# Patient Record
Sex: Male | Born: 1950 | Race: Black or African American | Hispanic: No | Marital: Married | Smoking: Never smoker
Health system: Southern US, Community
[De-identification: ages and names within clinical notes are randomized; demographics above are authoritative.]

---

## 1998-05-19 ENCOUNTER — Emergency Department (HOSPITAL_COMMUNITY): Admission: EM | Admit: 1998-05-19 | Discharge: 1998-05-19 | Payer: Self-pay | Admitting: Emergency Medicine

## 2002-04-16 ENCOUNTER — Emergency Department (HOSPITAL_COMMUNITY): Admission: EM | Admit: 2002-04-16 | Discharge: 2002-04-16 | Payer: Self-pay | Admitting: Emergency Medicine

## 2008-07-28 ENCOUNTER — Encounter: Admission: RE | Admit: 2008-07-28 | Discharge: 2008-07-28 | Payer: Self-pay | Admitting: Internal Medicine

## 2009-11-02 ENCOUNTER — Encounter: Admission: RE | Admit: 2009-11-02 | Discharge: 2009-11-02 | Payer: Self-pay | Admitting: Internal Medicine

## 2010-04-16 ENCOUNTER — Emergency Department (HOSPITAL_COMMUNITY): Admission: EM | Admit: 2010-04-16 | Discharge: 2010-04-16 | Payer: Self-pay | Admitting: Family Medicine

## 2010-04-30 ENCOUNTER — Encounter: Admission: RE | Admit: 2010-04-30 | Discharge: 2010-04-30 | Payer: Self-pay | Admitting: Family Medicine

## 2010-12-19 ENCOUNTER — Encounter: Payer: Self-pay | Admitting: Family Medicine

## 2011-03-13 IMAGING — CT CT L SPINE W/O CM
3 of 11 series · 12 of 33 positions shown, 14 images · non-contrast
Comparison: Radiography [DATE]

CLINICAL DATA: Low back pain.  Bilateral leg pain.

CT LUMBAR SPINE WITHOUT CONTRAST
TECHNIQUE: Multidetector CT imaging of the lumbar spine was
performed without intravenous contrast administration. Multiplanar
CT image reconstructions were also generated.

[Series 4: thin recons · axial · 0.27mm/px · z∈[-220,-56]mm · 4 of 881 slices shown, 5 images]
[im 177/881  soft-tissue]
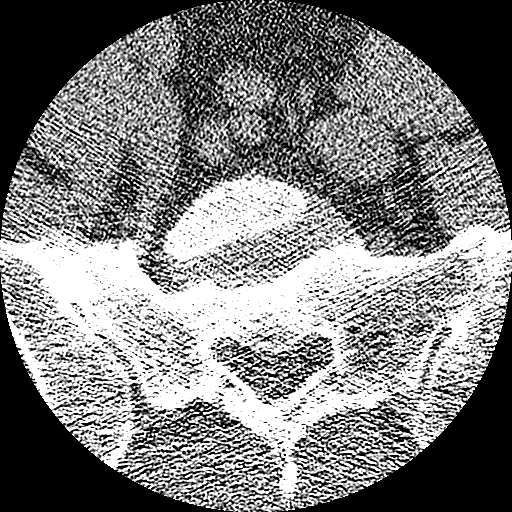
[im 177/881  bone]
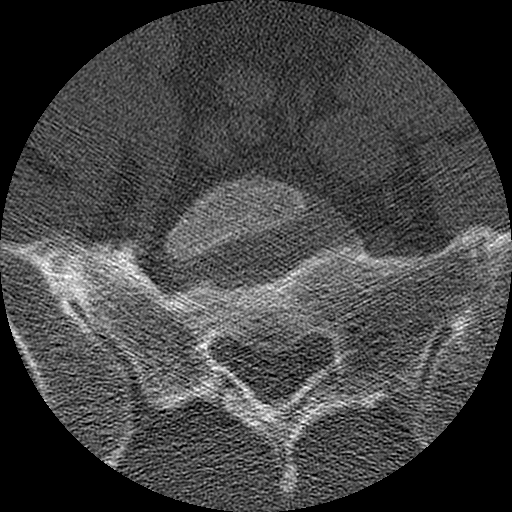
[im 353/881  bone]
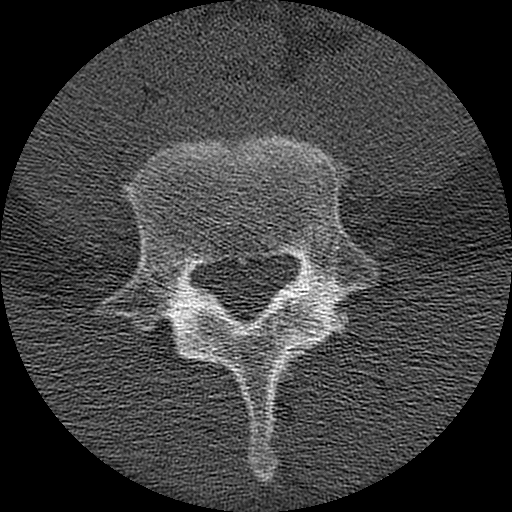
[im 529/881  bone]
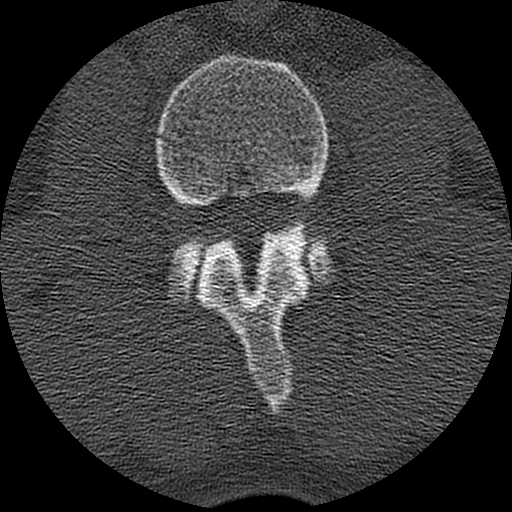
[im 705/881  bone]
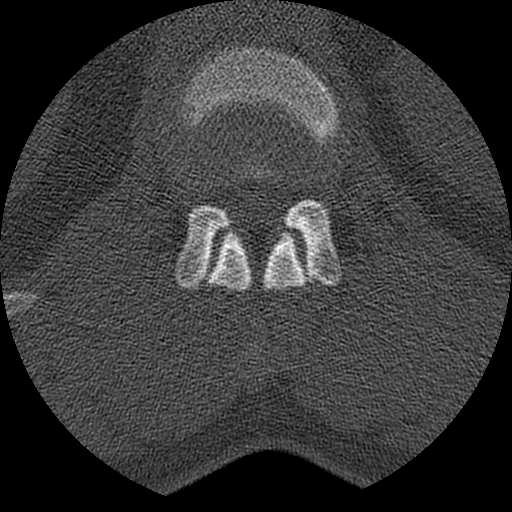

[Series 400: cor l-spine · coronal · 0.55mm/px · 3 of 60 slices shown]
[im 12/60  bone]
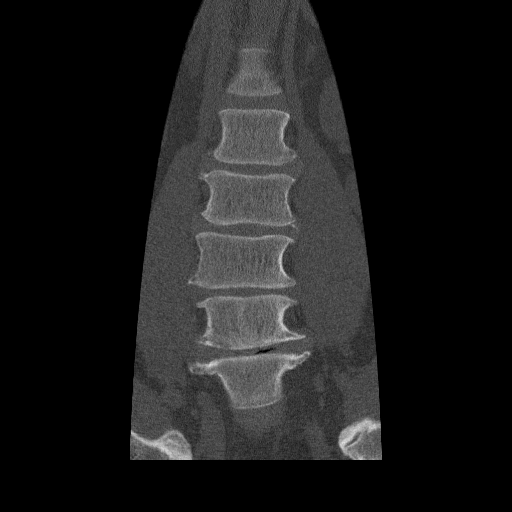
[im 24/60  bone]
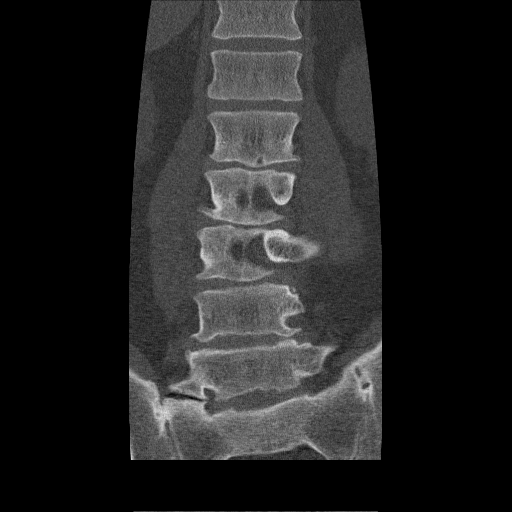
[im 36/60  bone]
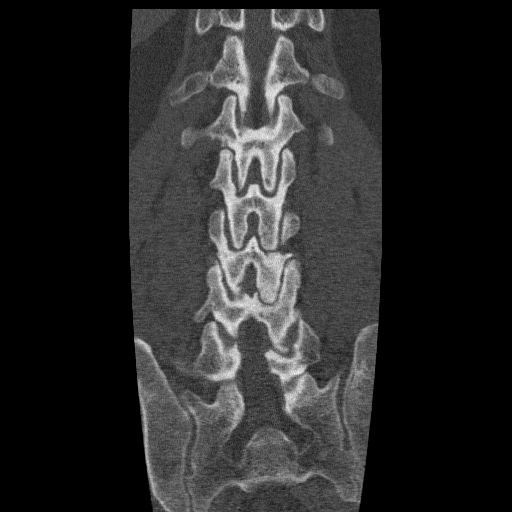

[Series 401: sag l-spine · sagittal · 0.55mm/px · 5 of 60 slices shown, 6 images]
[im 20/60  bone]
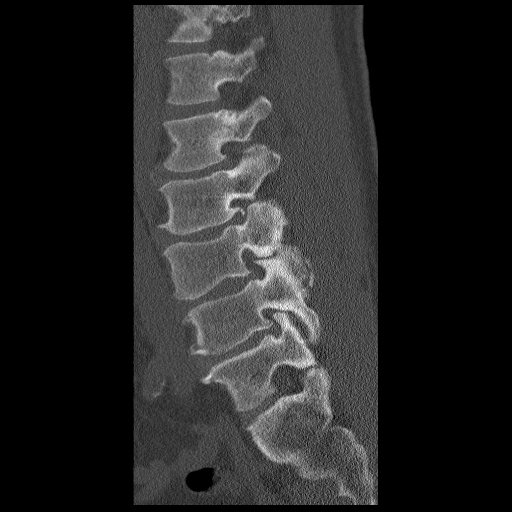
[im 25/60  bone]
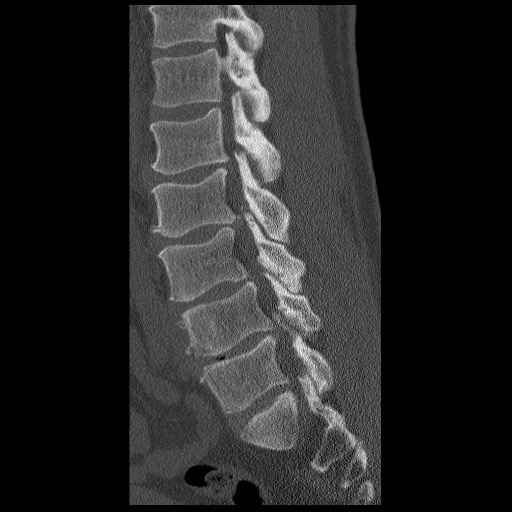
[im 30/60  soft-tissue]
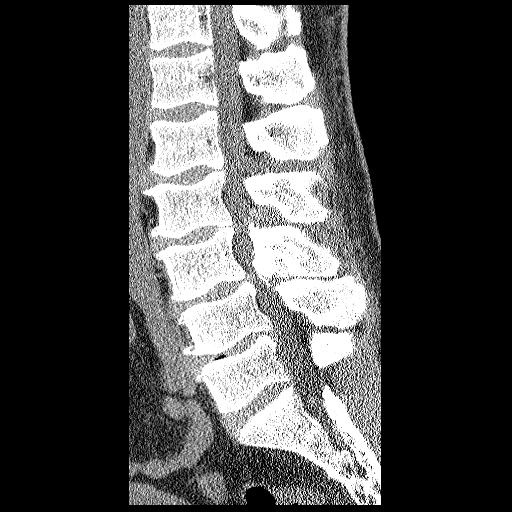
[im 30/60  bone]
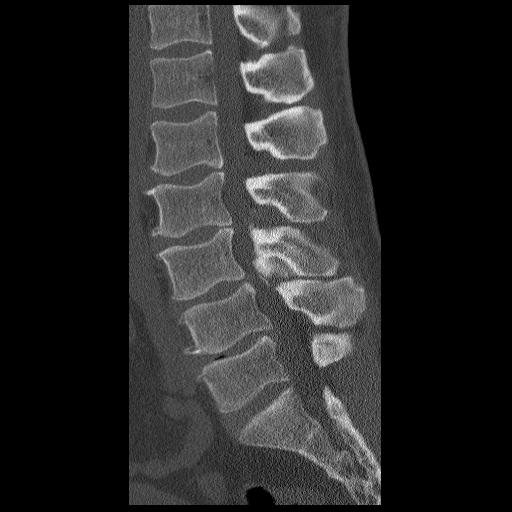
[im 35/60  bone]
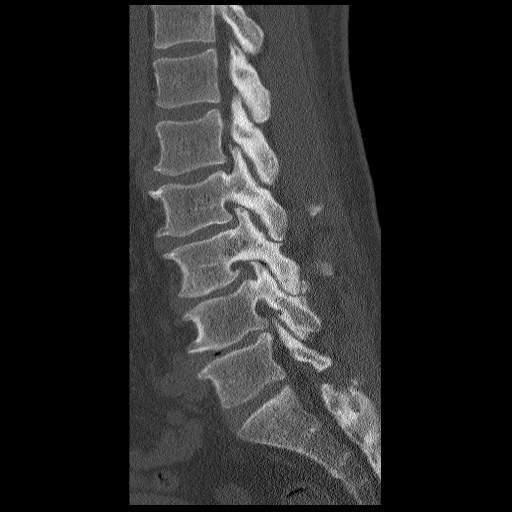
[im 40/60  bone]
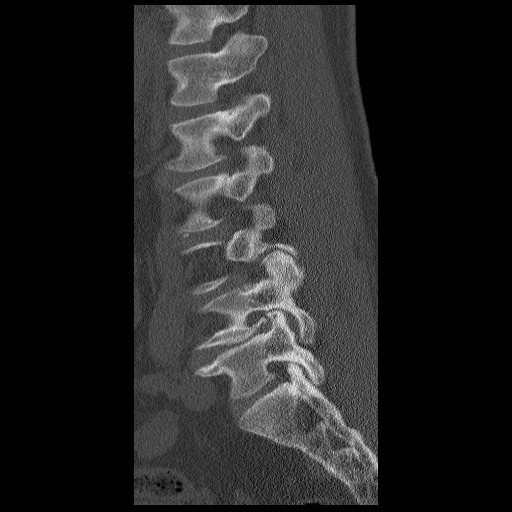

[12 of 33 positions shown; findings below may reference images not displayed]

FINDINGS: There is mild scoliosis convex to the left.  There are
diminutive twelfth ribs.

T11-12 and T12-L1:  Unremarkable interspaces.

L1-2:  There is severe spinal stenosis.  The canal small on a
congenital basis.  There are endplate osteophytes there is bulging
of the disc.  There is facet and ligamentous hypertrophy with
ligamentous calcification.  Neural compression could occur at this
level.

L2-3:  There is severe spinal stenosis.  The canal small on a
congenital basis.  There are endplate osteophytes there is broad-
based herniation of disc material.  The facets ligaments are
hypertrophic and there is ligamentous calcification.  There is
foraminal stenosis bilaterally, right worse than left.

L3-4:  There is severe spinal stenosis.  There are endplate
osteophytes and there is shallow protrusion of disc material.
There is advanced facet arthropathy and ligamentous hypertrophy.
There is anterolisthesis of 2 mm.  There is neural foraminal
stenosis bilaterally.

L4-5:  There is moderate multifactorial stenosis in general with
more severe stenoses of the lateral recesses and neural foramina
bilaterally.  Contributing factors include facet arthropathy and
ligamentous hypertrophy and bulging of the disc.

L5-S1:  There is some transitional features.  Central canal is
sufficiently patent.  The disc bulges mildly.  There is mild
foraminal narrowing bilaterally.  There is an anomalous
articulation on the right with the sacrum that shows some
degenerative change.

Sacral iliac joints show ankylosis bilaterally.
IMPRESSION: L5 is transitional.

Severe multifactorial spinal stenosis at L1-2, L2-3 and L3-4 as
outlined above.

Moderate stenosis at L4-5 of the central canal.  More severe
stenosis of the lateral recesses and neural foramina bilaterally.

Degenerative change at the articulation of the right side of L5 and
the sacrum.  Bilateral sacroiliac ankylosis.

## 2015-03-17 DIAGNOSIS — Z136 Encounter for screening for cardiovascular disorders: Secondary | ICD-10-CM | POA: Diagnosis not present

## 2015-03-17 DIAGNOSIS — Z125 Encounter for screening for malignant neoplasm of prostate: Secondary | ICD-10-CM | POA: Diagnosis not present

## 2015-03-17 DIAGNOSIS — Z23 Encounter for immunization: Secondary | ICD-10-CM | POA: Diagnosis not present

## 2015-03-17 DIAGNOSIS — R972 Elevated prostate specific antigen [PSA]: Secondary | ICD-10-CM | POA: Diagnosis not present

## 2015-03-17 DIAGNOSIS — Z0000 Encounter for general adult medical examination without abnormal findings: Secondary | ICD-10-CM | POA: Diagnosis not present

## 2015-03-17 DIAGNOSIS — Z131 Encounter for screening for diabetes mellitus: Secondary | ICD-10-CM | POA: Diagnosis not present

## 2016-04-26 DIAGNOSIS — H401121 Primary open-angle glaucoma, left eye, mild stage: Secondary | ICD-10-CM | POA: Diagnosis not present

## 2016-04-26 DIAGNOSIS — H1033 Unspecified acute conjunctivitis, bilateral: Secondary | ICD-10-CM | POA: Diagnosis not present

## 2016-05-19 DIAGNOSIS — H401121 Primary open-angle glaucoma, left eye, mild stage: Secondary | ICD-10-CM | POA: Diagnosis not present

## 2016-08-22 DIAGNOSIS — R6889 Other general symptoms and signs: Secondary | ICD-10-CM | POA: Diagnosis not present

## 2016-08-22 DIAGNOSIS — J069 Acute upper respiratory infection, unspecified: Secondary | ICD-10-CM | POA: Diagnosis not present

## 2016-10-17 DIAGNOSIS — R0989 Other specified symptoms and signs involving the circulatory and respiratory systems: Secondary | ICD-10-CM | POA: Diagnosis not present

## 2016-10-17 DIAGNOSIS — R1310 Dysphagia, unspecified: Secondary | ICD-10-CM | POA: Diagnosis not present

## 2017-08-16 DIAGNOSIS — Z23 Encounter for immunization: Secondary | ICD-10-CM | POA: Diagnosis not present

## 2017-08-16 DIAGNOSIS — L84 Corns and callosities: Secondary | ICD-10-CM | POA: Diagnosis not present

## 2017-08-16 DIAGNOSIS — Z136 Encounter for screening for cardiovascular disorders: Secondary | ICD-10-CM | POA: Diagnosis not present

## 2017-08-16 DIAGNOSIS — Z125 Encounter for screening for malignant neoplasm of prostate: Secondary | ICD-10-CM | POA: Diagnosis not present

## 2017-08-16 DIAGNOSIS — Z131 Encounter for screening for diabetes mellitus: Secondary | ICD-10-CM | POA: Diagnosis not present

## 2017-08-16 DIAGNOSIS — Z0000 Encounter for general adult medical examination without abnormal findings: Secondary | ICD-10-CM | POA: Diagnosis not present

## 2017-08-28 ENCOUNTER — Ambulatory Visit: Payer: Medicare HMO | Admitting: Podiatry

## 2017-09-06 ENCOUNTER — Ambulatory Visit (INDEPENDENT_AMBULATORY_CARE_PROVIDER_SITE_OTHER): Payer: Medicare HMO | Admitting: Podiatry

## 2017-09-06 ENCOUNTER — Encounter: Payer: Self-pay | Admitting: Podiatry

## 2017-09-06 VITALS — BP 150/85 | HR 64 | Resp 16

## 2017-09-06 DIAGNOSIS — M79675 Pain in left toe(s): Secondary | ICD-10-CM | POA: Diagnosis not present

## 2017-09-06 DIAGNOSIS — M79674 Pain in right toe(s): Secondary | ICD-10-CM | POA: Diagnosis not present

## 2017-09-06 DIAGNOSIS — B351 Tinea unguium: Secondary | ICD-10-CM

## 2017-09-06 DIAGNOSIS — L84 Corns and callosities: Secondary | ICD-10-CM | POA: Diagnosis not present

## 2017-09-06 NOTE — Progress Notes (Signed)
   Subjective:    Patient ID: Benjamin Stafford, male    DOB: 09-Mar-1951, 66 y.o.   MRN: [id number]  HPI    Review of Systems  All other systems reviewed and are negative.      Objective:   Physical Exam        Assessment & Plan:

## 2017-12-11 ENCOUNTER — Encounter: Payer: Medicare HMO | Admitting: Podiatry

## 2017-12-18 NOTE — Progress Notes (Signed)
This encounter was created in error - please disregard.

## 2018-03-22 DIAGNOSIS — R972 Elevated prostate specific antigen [PSA]: Secondary | ICD-10-CM | POA: Diagnosis not present

## 2018-03-22 DIAGNOSIS — Z131 Encounter for screening for diabetes mellitus: Secondary | ICD-10-CM | POA: Diagnosis not present

## 2018-03-22 DIAGNOSIS — E782 Mixed hyperlipidemia: Secondary | ICD-10-CM | POA: Diagnosis not present

## 2018-03-22 DIAGNOSIS — R200 Anesthesia of skin: Secondary | ICD-10-CM | POA: Diagnosis not present

## 2018-03-22 DIAGNOSIS — Z125 Encounter for screening for malignant neoplasm of prostate: Secondary | ICD-10-CM | POA: Diagnosis not present

## 2018-03-22 DIAGNOSIS — G5632 Lesion of radial nerve, left upper limb: Secondary | ICD-10-CM | POA: Diagnosis not present

## 2020-12-01 DIAGNOSIS — Z510 Encounter for antineoplastic radiation therapy: Secondary | ICD-10-CM | POA: Diagnosis not present

## 2020-12-03 DIAGNOSIS — Z510 Encounter for antineoplastic radiation therapy: Secondary | ICD-10-CM | POA: Diagnosis not present

## 2020-12-04 DIAGNOSIS — Z510 Encounter for antineoplastic radiation therapy: Secondary | ICD-10-CM | POA: Diagnosis not present

## 2020-12-07 DIAGNOSIS — Z510 Encounter for antineoplastic radiation therapy: Secondary | ICD-10-CM | POA: Diagnosis not present

## 2020-12-08 DIAGNOSIS — Z510 Encounter for antineoplastic radiation therapy: Secondary | ICD-10-CM | POA: Diagnosis not present

## 2020-12-09 DIAGNOSIS — Z510 Encounter for antineoplastic radiation therapy: Secondary | ICD-10-CM | POA: Diagnosis not present

## 2020-12-10 DIAGNOSIS — Z510 Encounter for antineoplastic radiation therapy: Secondary | ICD-10-CM | POA: Diagnosis not present

## 2020-12-11 DIAGNOSIS — Z510 Encounter for antineoplastic radiation therapy: Secondary | ICD-10-CM | POA: Diagnosis not present

## 2020-12-15 DIAGNOSIS — Z510 Encounter for antineoplastic radiation therapy: Secondary | ICD-10-CM | POA: Diagnosis not present

## 2020-12-16 DIAGNOSIS — Z510 Encounter for antineoplastic radiation therapy: Secondary | ICD-10-CM | POA: Diagnosis not present

## 2020-12-17 DIAGNOSIS — Z510 Encounter for antineoplastic radiation therapy: Secondary | ICD-10-CM | POA: Diagnosis not present

## 2020-12-18 DIAGNOSIS — Z510 Encounter for antineoplastic radiation therapy: Secondary | ICD-10-CM | POA: Diagnosis not present

## 2020-12-21 DIAGNOSIS — Z510 Encounter for antineoplastic radiation therapy: Secondary | ICD-10-CM | POA: Diagnosis not present

## 2020-12-22 DIAGNOSIS — Z510 Encounter for antineoplastic radiation therapy: Secondary | ICD-10-CM | POA: Diagnosis not present

## 2020-12-23 DIAGNOSIS — Z510 Encounter for antineoplastic radiation therapy: Secondary | ICD-10-CM | POA: Diagnosis not present

## 2020-12-25 DIAGNOSIS — Z510 Encounter for antineoplastic radiation therapy: Secondary | ICD-10-CM | POA: Diagnosis not present

## 2020-12-28 DIAGNOSIS — Z510 Encounter for antineoplastic radiation therapy: Secondary | ICD-10-CM | POA: Diagnosis not present

## 2020-12-29 DIAGNOSIS — Z510 Encounter for antineoplastic radiation therapy: Secondary | ICD-10-CM | POA: Diagnosis not present

## 2020-12-29 DIAGNOSIS — N1330 Unspecified hydronephrosis: Secondary | ICD-10-CM | POA: Diagnosis not present

## 2020-12-29 DIAGNOSIS — N200 Calculus of kidney: Secondary | ICD-10-CM | POA: Diagnosis not present

## 2020-12-30 DIAGNOSIS — Z510 Encounter for antineoplastic radiation therapy: Secondary | ICD-10-CM | POA: Diagnosis not present

## 2020-12-31 DIAGNOSIS — Z510 Encounter for antineoplastic radiation therapy: Secondary | ICD-10-CM | POA: Diagnosis not present

## 2021-01-01 DIAGNOSIS — Z510 Encounter for antineoplastic radiation therapy: Secondary | ICD-10-CM | POA: Diagnosis not present

## 2021-01-04 DIAGNOSIS — Z510 Encounter for antineoplastic radiation therapy: Secondary | ICD-10-CM | POA: Diagnosis not present

## 2021-01-05 DIAGNOSIS — Z510 Encounter for antineoplastic radiation therapy: Secondary | ICD-10-CM | POA: Diagnosis not present

## 2021-01-06 DIAGNOSIS — Z510 Encounter for antineoplastic radiation therapy: Secondary | ICD-10-CM | POA: Diagnosis not present

## 2021-01-07 DIAGNOSIS — Z510 Encounter for antineoplastic radiation therapy: Secondary | ICD-10-CM | POA: Diagnosis not present

## 2021-01-08 DIAGNOSIS — Z510 Encounter for antineoplastic radiation therapy: Secondary | ICD-10-CM | POA: Diagnosis not present

## 2021-01-11 DIAGNOSIS — Z510 Encounter for antineoplastic radiation therapy: Secondary | ICD-10-CM | POA: Diagnosis not present

## 2021-01-12 DIAGNOSIS — Z510 Encounter for antineoplastic radiation therapy: Secondary | ICD-10-CM | POA: Diagnosis not present

## 2021-01-13 DIAGNOSIS — Z510 Encounter for antineoplastic radiation therapy: Secondary | ICD-10-CM | POA: Diagnosis not present

## 2021-01-14 DIAGNOSIS — Z510 Encounter for antineoplastic radiation therapy: Secondary | ICD-10-CM | POA: Diagnosis not present

## 2021-01-15 DIAGNOSIS — Z510 Encounter for antineoplastic radiation therapy: Secondary | ICD-10-CM | POA: Diagnosis not present

## 2021-01-18 DIAGNOSIS — Z510 Encounter for antineoplastic radiation therapy: Secondary | ICD-10-CM | POA: Diagnosis not present

## 2021-01-19 DIAGNOSIS — Z510 Encounter for antineoplastic radiation therapy: Secondary | ICD-10-CM | POA: Diagnosis not present

## 2021-01-20 DIAGNOSIS — Z510 Encounter for antineoplastic radiation therapy: Secondary | ICD-10-CM | POA: Diagnosis not present

## 2021-01-21 DIAGNOSIS — Z510 Encounter for antineoplastic radiation therapy: Secondary | ICD-10-CM | POA: Diagnosis not present

## 2021-01-22 DIAGNOSIS — Z510 Encounter for antineoplastic radiation therapy: Secondary | ICD-10-CM | POA: Diagnosis not present

## 2021-01-25 DIAGNOSIS — Z510 Encounter for antineoplastic radiation therapy: Secondary | ICD-10-CM | POA: Diagnosis not present

## 2021-01-26 DIAGNOSIS — Z510 Encounter for antineoplastic radiation therapy: Secondary | ICD-10-CM | POA: Diagnosis not present

## 2021-04-23 DIAGNOSIS — Z5111 Encounter for antineoplastic chemotherapy: Secondary | ICD-10-CM | POA: Diagnosis not present

## 2021-07-14 DIAGNOSIS — M1712 Unilateral primary osteoarthritis, left knee: Secondary | ICD-10-CM | POA: Diagnosis not present

## 2021-07-14 DIAGNOSIS — M1711 Unilateral primary osteoarthritis, right knee: Secondary | ICD-10-CM | POA: Diagnosis not present

## 2021-12-02 DIAGNOSIS — M4319 Spondylolisthesis, multiple sites in spine: Secondary | ICD-10-CM | POA: Diagnosis not present

## 2021-12-02 DIAGNOSIS — I700 Atherosclerosis of aorta: Secondary | ICD-10-CM | POA: Diagnosis not present

## 2021-12-02 DIAGNOSIS — Z041 Encounter for examination and observation following transport accident: Secondary | ICD-10-CM | POA: Diagnosis not present

## 2021-12-02 DIAGNOSIS — K7689 Other specified diseases of liver: Secondary | ICD-10-CM | POA: Diagnosis not present

## 2021-12-02 DIAGNOSIS — M5126 Other intervertebral disc displacement, lumbar region: Secondary | ICD-10-CM | POA: Diagnosis not present

## 2021-12-02 DIAGNOSIS — K4090 Unilateral inguinal hernia, without obstruction or gangrene, not specified as recurrent: Secondary | ICD-10-CM | POA: Diagnosis not present

## 2021-12-02 DIAGNOSIS — S298XXA Other specified injuries of thorax, initial encounter: Secondary | ICD-10-CM | POA: Diagnosis not present

## 2021-12-02 DIAGNOSIS — Z743 Need for continuous supervision: Secondary | ICD-10-CM | POA: Diagnosis not present

## 2021-12-02 DIAGNOSIS — R911 Solitary pulmonary nodule: Secondary | ICD-10-CM | POA: Diagnosis not present

## 2021-12-02 DIAGNOSIS — K5730 Diverticulosis of large intestine without perforation or abscess without bleeding: Secondary | ICD-10-CM | POA: Diagnosis not present

## 2021-12-02 DIAGNOSIS — R6889 Other general symptoms and signs: Secondary | ICD-10-CM | POA: Diagnosis not present

## 2021-12-02 DIAGNOSIS — Y999 Unspecified external cause status: Secondary | ICD-10-CM | POA: Diagnosis not present

## 2021-12-02 DIAGNOSIS — K429 Umbilical hernia without obstruction or gangrene: Secondary | ICD-10-CM | POA: Diagnosis not present

## 2021-12-02 DIAGNOSIS — M47816 Spondylosis without myelopathy or radiculopathy, lumbar region: Secondary | ICD-10-CM | POA: Diagnosis not present

## 2021-12-02 DIAGNOSIS — S0990XA Unspecified injury of head, initial encounter: Secondary | ICD-10-CM | POA: Diagnosis not present

## 2021-12-02 DIAGNOSIS — R10813 Right lower quadrant abdominal tenderness: Secondary | ICD-10-CM | POA: Diagnosis not present

## 2021-12-02 DIAGNOSIS — M2578 Osteophyte, vertebrae: Secondary | ICD-10-CM | POA: Diagnosis not present

## 2021-12-02 DIAGNOSIS — M4807 Spinal stenosis, lumbosacral region: Secondary | ICD-10-CM | POA: Diagnosis not present

## 2021-12-02 DIAGNOSIS — M542 Cervicalgia: Secondary | ICD-10-CM | POA: Diagnosis not present

## 2021-12-02 DIAGNOSIS — M47817 Spondylosis without myelopathy or radiculopathy, lumbosacral region: Secondary | ICD-10-CM | POA: Diagnosis not present

## 2021-12-23 DIAGNOSIS — S8992XA Unspecified injury of left lower leg, initial encounter: Secondary | ICD-10-CM | POA: Diagnosis not present

## 2021-12-23 DIAGNOSIS — E782 Mixed hyperlipidemia: Secondary | ICD-10-CM | POA: Diagnosis not present

## 2021-12-23 DIAGNOSIS — M549 Dorsalgia, unspecified: Secondary | ICD-10-CM | POA: Diagnosis not present

## 2021-12-23 DIAGNOSIS — Z1211 Encounter for screening for malignant neoplasm of colon: Secondary | ICD-10-CM | POA: Diagnosis not present

## 2021-12-23 DIAGNOSIS — M25562 Pain in left knee: Secondary | ICD-10-CM | POA: Diagnosis not present

## 2021-12-23 DIAGNOSIS — R030 Elevated blood-pressure reading, without diagnosis of hypertension: Secondary | ICD-10-CM | POA: Diagnosis not present

## 2021-12-23 DIAGNOSIS — Z0000 Encounter for general adult medical examination without abnormal findings: Secondary | ICD-10-CM | POA: Diagnosis not present

## 2021-12-30 DIAGNOSIS — N200 Calculus of kidney: Secondary | ICD-10-CM | POA: Diagnosis not present

## 2021-12-30 DIAGNOSIS — N281 Cyst of kidney, acquired: Secondary | ICD-10-CM | POA: Diagnosis not present

## 2022-03-24 DIAGNOSIS — M25552 Pain in left hip: Secondary | ICD-10-CM | POA: Diagnosis not present

## 2022-03-24 DIAGNOSIS — M79671 Pain in right foot: Secondary | ICD-10-CM | POA: Diagnosis not present

## 2022-04-12 ENCOUNTER — Ambulatory Visit: Payer: Medicare Other | Admitting: Podiatry

## 2022-04-12 DIAGNOSIS — L989 Disorder of the skin and subcutaneous tissue, unspecified: Secondary | ICD-10-CM | POA: Diagnosis not present

## 2022-04-12 DIAGNOSIS — M79671 Pain in right foot: Secondary | ICD-10-CM | POA: Diagnosis not present

## 2022-04-12 DIAGNOSIS — M79605 Pain in left leg: Secondary | ICD-10-CM | POA: Diagnosis not present

## 2022-04-12 NOTE — Patient Instructions (Signed)
I have ordered a medication for you that will come from [facility] in [place]. They should be calling you to verify insurance and will mail the medication to you. If you live close by then you can go by their pharmacy to pick up the medication. Their phone number is [phone]. If you do not hear from them in the next few days, please give us a call at [phone].   Keep the bandage on for 24 hours. At that time, remove and clean with soap and water. If it hurts or burns before 24 hours go ahead and remove the bandage and wash with soap and water. Keep the area clean. If there is any blistering cover with antibiotic ointment and a bandage. Monitor for any redness, drainage, or other signs of infection. Call the office if any are to occur. If you have any questions, please call the office at [phone].  

## 2022-04-13 ENCOUNTER — Telehealth: Payer: Self-pay | Admitting: *Deleted

## 2022-04-13 NOTE — Telephone Encounter (Signed)
Patient's wife is calling for prescription that she thought was to go to [place], not there. She is also wanting the name of Orthopaedic referral. ? Returned the call back to wife giving her phone # for [facility], where medication had been sent,they have been calling them with no response. ?
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
## 2022-04-15 DIAGNOSIS — M25562 Pain in left knee: Secondary | ICD-10-CM | POA: Diagnosis not present

## 2022-04-15 DIAGNOSIS — M25552 Pain in left hip: Secondary | ICD-10-CM | POA: Diagnosis not present

## 2022-04-15 DIAGNOSIS — M7632 Iliotibial band syndrome, left leg: Secondary | ICD-10-CM | POA: Diagnosis not present

## 2022-04-15 DIAGNOSIS — M7062 Trochanteric bursitis, left hip: Secondary | ICD-10-CM | POA: Diagnosis not present

## 2022-04-15 DIAGNOSIS — S76212A Strain of adductor muscle, fascia and tendon of left thigh, initial encounter: Secondary | ICD-10-CM | POA: Diagnosis not present

## 2022-04-20 ENCOUNTER — Encounter: Payer: Self-pay | Admitting: *Deleted

## 2022-04-20 NOTE — Telephone Encounter (Signed)
Spoke with patient and he is to pick up prescription today from [place], has been scheduled for Ortho care on Saturday for an MRI.

## 2022-06-21 DIAGNOSIS — R232 Flushing: Secondary | ICD-10-CM | POA: Diagnosis not present

## 2022-06-21 DIAGNOSIS — M25561 Pain in right knee: Secondary | ICD-10-CM | POA: Diagnosis not present

## 2022-06-21 DIAGNOSIS — M79606 Pain in leg, unspecified: Secondary | ICD-10-CM | POA: Diagnosis not present

## 2022-06-21 DIAGNOSIS — M25562 Pain in left knee: Secondary | ICD-10-CM | POA: Diagnosis not present

## 2022-07-29 ENCOUNTER — Emergency Department (HOSPITAL_BASED_OUTPATIENT_CLINIC_OR_DEPARTMENT_OTHER)
Admission: EM | Admit: 2022-07-29 | Discharge: 2022-07-29 | Disposition: A | Discharge status: Home / Self Care | Payer: Medicare Other | Attending: Emergency Medicine | Admitting: Emergency Medicine

## 2022-07-29 ENCOUNTER — Emergency Department (HOSPITAL_BASED_OUTPATIENT_CLINIC_OR_DEPARTMENT_OTHER): Payer: Medicare Other

## 2022-07-29 ENCOUNTER — Other Ambulatory Visit: Payer: Self-pay

## 2022-07-29 ENCOUNTER — Encounter (HOSPITAL_BASED_OUTPATIENT_CLINIC_OR_DEPARTMENT_OTHER): Payer: Self-pay

## 2022-07-29 DIAGNOSIS — S0990XA Unspecified injury of head, initial encounter: Secondary | ICD-10-CM | POA: Diagnosis not present

## 2022-07-29 DIAGNOSIS — S199XXA Unspecified injury of neck, initial encounter: Secondary | ICD-10-CM | POA: Diagnosis present

## 2022-07-29 DIAGNOSIS — W19XXXA Unspecified fall, initial encounter: Secondary | ICD-10-CM

## 2022-07-29 DIAGNOSIS — W541XXA Struck by dog, initial encounter: Secondary | ICD-10-CM | POA: Diagnosis not present

## 2022-07-29 DIAGNOSIS — Z043 Encounter for examination and observation following other accident: Secondary | ICD-10-CM | POA: Diagnosis not present

## 2022-07-29 DIAGNOSIS — S161XXA Strain of muscle, fascia and tendon at neck level, initial encounter: Secondary | ICD-10-CM | POA: Insufficient documentation

## 2022-07-29 DIAGNOSIS — M542 Cervicalgia: Secondary | ICD-10-CM | POA: Diagnosis not present

## 2022-07-29 NOTE — ED Triage Notes (Signed)
Patient states he tripped over the dog bed and hit his head. Denies blood thinners. C/o mid neck pain - tender on cervical spine. Patient placed in C-collar in triage. No lower or mid back pain.   No loc reported.

## 2022-07-29 NOTE — ED Provider Notes (Signed)
[facility] EMERGENCY DEPARTMENT Provider Note   CSN: [id number] Arrival date & time: 07/29/22  1337     History  Chief Complaint  Patient presents with   Head Injury    Benjamin Stafford is a 71 y.o. male.  Patient tripped over a dog bed at home hitting his head on the PNO.  Hit the right forehead area no loss of consciousness not on any blood thinners also complaining with some right lateral neck pain.  C-collar placed in triage.  No extremity injuries no low back injuries.  I did not hit his chest or abdomen.  Patient does have a bump on his head.  Past medical history noncontributory.       Home Medications Prior to Admission medications   Medication Sig Start Date End Date Taking? Authorizing Provider  diphenhydrAMINE (BENADRYL) 25 MG tablet Take 25 mg by mouth as needed.     [provider]  loratadine (CLARITIN) 10 MG tablet Take 10 mg by mouth daily as needed for allergies.    [provider]      Allergies    Sulfa antibiotics    Review of Systems   Review of Systems  Constitutional:  Negative for chills and fever.  HENT:  Negative for ear pain and sore throat.   Eyes:  Negative for pain and visual disturbance.  Respiratory:  Negative for cough and shortness of breath.   Cardiovascular:  Negative for chest pain and palpitations.  Gastrointestinal:  Negative for abdominal pain and vomiting.  Genitourinary:  Negative for dysuria and hematuria.  Musculoskeletal:  Positive for neck pain. Negative for arthralgias and back pain.  Skin:  Negative for color change and rash.  Neurological:  Positive for headaches. Negative for seizures and syncope.  All other systems reviewed and are negative.   Physical Exam Updated Vital Signs BP (!) 153/84   Pulse 70   Temp 98.2 F (36.8 C)   Resp 17   Ht 1.88 m (6\' 2" )   Wt 108.9 kg   SpO2 99%   BMI 30.81 kg/m  Physical Exam Vitals and nursing note reviewed.  Constitutional:      General:  He is not in acute distress.    Appearance: He is well-developed.  HENT:     Head: Normocephalic.     Comments: Patient with swelling and hematoma to the right forehead area.  No wound. Eyes:     Extraocular Movements: Extraocular movements intact.     Conjunctiva/sclera: Conjunctivae normal.     Pupils: Pupils are equal, round, and reactive to light.  Neck:     Comments: Cervical collar in place. Cardiovascular:     Rate and Rhythm: Normal rate and regular rhythm.     Heart sounds: No murmur heard. Pulmonary:     Effort: Pulmonary effort is normal. No respiratory distress.     Breath sounds: Normal breath sounds.  Abdominal:     Palpations: Abdomen is soft.     Tenderness: There is no abdominal tenderness.  Musculoskeletal:        General: No swelling, tenderness or signs of injury.  Skin:    General: Skin is warm and dry.     Capillary Refill: Capillary refill takes less than 2 seconds.  Neurological:     General: No focal deficit present.     Mental Status: He is alert and oriented to person, place, and time.     Cranial Nerves: No cranial nerve deficit.  Sensory: No sensory deficit.     Motor: No weakness.  Psychiatric:        Mood and Affect: Mood normal.     ED Results / Procedures / Treatments   Labs (all labs ordered are listed, but only abnormal results are displayed) Labs Reviewed - No data to display  EKG None  Radiology CT Head Wo Contrast  Result Date: 07/29/2022 CLINICAL DATA:  Head injury after fall. EXAM: CT HEAD WITHOUT CONTRAST CT CERVICAL SPINE WITHOUT CONTRAST TECHNIQUE: Multidetector CT imaging of the head and cervical spine was performed following the standard protocol without intravenous contrast. Multiplanar CT image reconstructions of the cervical spine were also generated. RADIATION DOSE REDUCTION: This exam was performed according to the departmental dose-optimization program which includes automated exposure control, adjustment of the mA  and/or kV according to patient size and/or use of iterative reconstruction technique. COMPARISON:  December 02, 2021. FINDINGS: CT HEAD FINDINGS Brain: No evidence of acute infarction, hemorrhage, hydrocephalus, extra-axial collection or mass lesion/mass effect. Vascular: No hyperdense vessel or unexpected calcification. Skull: Normal. Negative for fracture or focal lesion. Sinuses/Orbits: No acute finding. Other: None. CT CERVICAL SPINE FINDINGS Alignment: Normal. Skull base and vertebrae: No acute fracture. No primary bone lesion or focal pathologic process. Soft tissues and spinal canal: No prevertebral fluid or swelling. No visible canal hematoma. Disc levels: Moderate degenerative disc disease is noted at C3-4 and C4-5. Severe degenerative disc disease is noted at C5-6 and C6-7. Upper chest: Negative. Other: None. IMPRESSION: No acute intracranial abnormality seen. 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 No acute abnormality is noted. Electronically Signed   By: Lupita Raider M.D.   On: 07/29/2022 14:50   CT Cervical Spine Wo Contrast  Result Date: 07/29/2022 CLINICAL DATA:  Head injury after fall. EXAM: CT HEAD WITHOUT CONTRAST CT CERVICAL SPINE WITHOUT CONTRAST TECHNIQUE: Multidetector CT imaging of the head and cervical spine was performed following the standard protocol without intravenous contrast. Multiplanar CT image reconstructions of the cervical spine were also generated. RADIATION DOSE REDUCTION: This exam was performed according to the departmental dose-optimization program which includes automated exposure control, adjustment of the mA and/or kV according to patient size and/or use of iterative reconstruction technique. COMPARISON:  December 02, 2021. FINDINGS: CT HEAD FINDINGS Brain: No evidence of acute infarction, hemorrhage, hydrocephalus, extra-axial collection or mass lesion/mass effect. Vascular: No hyperdense vessel or unexpected calcification. Skull: Normal.  Negative for fracture or focal lesion. Sinuses/Orbits: No acute finding. Other: None. CT CERVICAL SPINE FINDINGS Alignment: Normal. Skull base and vertebrae: No acute fracture. No primary bone lesion or focal pathologic process. Soft tissues and spinal canal: No prevertebral fluid or swelling. No visible canal hematoma. Disc levels: Moderate degenerative disc disease is noted at C3-4 and C4-5. Severe degenerative disc disease is noted at C5-6 and C6-7. Upper chest: Negative. Other: None. IMPRESSION: No acute intracranial abnormality seen. 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 No acute abnormality is noted. Electronically Signed   By: Lupita Raider M.D.   On: 07/29/2022 14:50   DG Cervical Spine 2 or 3 views  Result Date: 07/29/2022 CLINICAL DATA:  Neck pain after fall. EXAM: CERVICAL SPINE - 2-3 VIEW COMPARISON:  None Available. FINDINGS: There is no evidence of cervical spine fracture or prevertebral soft tissue swelling. Alignment is normal. Moderate degenerative disc disease is noted at C4-5, C5-6 and C6-7 with anterior osteophyte formation. IMPRESSION: Moderate multilevel degenerative disc disease. No acute abnormality seen.  Electronically Signed   By: Lupita Raider M.D.   On: 07/29/2022 14:09    Procedures Procedures    Medications Ordered in ED Medications - No data to display  ED Course/ Medical Decision Making/ A&P                           Medical Decision Making Amount and/or Complexity of Data Reviewed Radiology: ordered.   CT head CT neck without any acute findings.  Patient could possibly have a concussion but there was no loss of consciousness.  Will treat symptomatically.  Patient will follow-up with primary care doctor.  Following removal of c-collar patient with good range of motion of the neck. Final Clinical Impression(s) / ED Diagnoses Final diagnoses:  Fall, initial encounter  Injury of head, initial encounter  Strain of neck muscle,  initial encounter    Rx / DC Orders ED Discharge Orders     None         Vanetta Mulders, MD 07/29/22 1524

## 2022-07-29 NOTE — Discharge Instructions (Signed)
CT head and neck without any acute findings.  Still could have a concussion as we discussed.  But nothing concerning on the CAT scans.  Expect to be sore and stiff for the next few days.

## 2022-11-01 DIAGNOSIS — K640 First degree hemorrhoids: Secondary | ICD-10-CM | POA: Diagnosis not present

## 2022-12-22 DIAGNOSIS — N1831 Chronic kidney disease, stage 3a: Secondary | ICD-10-CM | POA: Diagnosis not present

## 2022-12-22 DIAGNOSIS — E782 Mixed hyperlipidemia: Secondary | ICD-10-CM | POA: Diagnosis not present

## 2022-12-22 DIAGNOSIS — Z0000 Encounter for general adult medical examination without abnormal findings: Secondary | ICD-10-CM | POA: Diagnosis not present

## 2022-12-22 DIAGNOSIS — Z9181 History of falling: Secondary | ICD-10-CM | POA: Diagnosis not present

## 2022-12-22 DIAGNOSIS — I129 Hypertensive chronic kidney disease with stage 1 through stage 4 chronic kidney disease, or unspecified chronic kidney disease: Secondary | ICD-10-CM | POA: Diagnosis not present

## 2023-01-03 DIAGNOSIS — N210 Calculus in bladder: Secondary | ICD-10-CM | POA: Diagnosis not present

## 2023-01-03 DIAGNOSIS — N200 Calculus of kidney: Secondary | ICD-10-CM | POA: Diagnosis not present

## 2023-01-03 DIAGNOSIS — N281 Cyst of kidney, acquired: Secondary | ICD-10-CM | POA: Diagnosis not present

## 2023-01-03 DIAGNOSIS — N201 Calculus of ureter: Secondary | ICD-10-CM | POA: Diagnosis not present

## 2023-02-21 DIAGNOSIS — R55 Syncope and collapse: Secondary | ICD-10-CM | POA: Diagnosis not present

## 2023-02-21 DIAGNOSIS — I10 Essential (primary) hypertension: Secondary | ICD-10-CM | POA: Diagnosis not present

## 2023-02-21 DIAGNOSIS — E782 Mixed hyperlipidemia: Secondary | ICD-10-CM | POA: Diagnosis not present

## 2023-02-21 DIAGNOSIS — M2550 Pain in unspecified joint: Secondary | ICD-10-CM | POA: Diagnosis not present

## 2023-03-01 DIAGNOSIS — H401131 Primary open-angle glaucoma, bilateral, mild stage: Secondary | ICD-10-CM | POA: Diagnosis not present

## 2023-03-21 DIAGNOSIS — M2550 Pain in unspecified joint: Secondary | ICD-10-CM | POA: Diagnosis not present

## 2023-03-21 DIAGNOSIS — I10 Essential (primary) hypertension: Secondary | ICD-10-CM | POA: Diagnosis not present

## 2023-09-11 DIAGNOSIS — M25552 Pain in left hip: Secondary | ICD-10-CM | POA: Diagnosis not present

## 2023-09-11 DIAGNOSIS — M25551 Pain in right hip: Secondary | ICD-10-CM | POA: Diagnosis not present

## 2023-09-21 DIAGNOSIS — M79652 Pain in left thigh: Secondary | ICD-10-CM | POA: Diagnosis not present

## 2023-09-21 DIAGNOSIS — M79651 Pain in right thigh: Secondary | ICD-10-CM | POA: Diagnosis not present

## 2023-09-21 DIAGNOSIS — M545 Low back pain, unspecified: Secondary | ICD-10-CM | POA: Diagnosis not present

## 2023-10-09 DIAGNOSIS — M545 Low back pain, unspecified: Secondary | ICD-10-CM | POA: Diagnosis not present

## 2023-10-13 DIAGNOSIS — M545 Low back pain, unspecified: Secondary | ICD-10-CM | POA: Diagnosis not present

## 2023-10-19 DIAGNOSIS — Z1211 Encounter for screening for malignant neoplasm of colon: Secondary | ICD-10-CM | POA: Diagnosis not present

## 2023-10-31 DIAGNOSIS — M5416 Radiculopathy, lumbar region: Secondary | ICD-10-CM | POA: Diagnosis not present

## 2023-12-29 DIAGNOSIS — Z923 Personal history of irradiation: Secondary | ICD-10-CM | POA: Diagnosis not present

## 2023-12-29 DIAGNOSIS — I10 Essential (primary) hypertension: Secondary | ICD-10-CM | POA: Diagnosis not present

## 2023-12-29 DIAGNOSIS — Z23 Encounter for immunization: Secondary | ICD-10-CM | POA: Diagnosis not present

## 2023-12-29 DIAGNOSIS — Z0000 Encounter for general adult medical examination without abnormal findings: Secondary | ICD-10-CM | POA: Diagnosis not present

## 2023-12-29 DIAGNOSIS — N1831 Chronic kidney disease, stage 3a: Secondary | ICD-10-CM | POA: Diagnosis not present

## 2023-12-29 DIAGNOSIS — E782 Mixed hyperlipidemia: Secondary | ICD-10-CM | POA: Diagnosis not present

## 2024-01-04 DIAGNOSIS — N289 Disorder of kidney and ureter, unspecified: Secondary | ICD-10-CM | POA: Diagnosis not present

## 2024-01-09 DIAGNOSIS — N3289 Other specified disorders of bladder: Secondary | ICD-10-CM | POA: Diagnosis not present

## 2024-01-09 DIAGNOSIS — N281 Cyst of kidney, acquired: Secondary | ICD-10-CM | POA: Diagnosis not present

## 2024-05-16 DIAGNOSIS — N200 Calculus of kidney: Secondary | ICD-10-CM | POA: Diagnosis not present

## 2024-05-16 DIAGNOSIS — N281 Cyst of kidney, acquired: Secondary | ICD-10-CM | POA: Diagnosis not present

## 2024-05-16 DIAGNOSIS — I129 Hypertensive chronic kidney disease with stage 1 through stage 4 chronic kidney disease, or unspecified chronic kidney disease: Secondary | ICD-10-CM | POA: Diagnosis not present

## 2024-05-16 DIAGNOSIS — N183 Chronic kidney disease, stage 3 unspecified: Secondary | ICD-10-CM | POA: Diagnosis not present

## 2024-05-16 DIAGNOSIS — Z791 Long term (current) use of non-steroidal anti-inflammatories (NSAID): Secondary | ICD-10-CM | POA: Diagnosis not present

## 2024-05-16 DIAGNOSIS — R801 Persistent proteinuria, unspecified: Secondary | ICD-10-CM | POA: Diagnosis not present

## 2024-06-04 DIAGNOSIS — M5416 Radiculopathy, lumbar region: Secondary | ICD-10-CM | POA: Diagnosis not present

## 2024-06-11 DIAGNOSIS — M79604 Pain in right leg: Secondary | ICD-10-CM | POA: Diagnosis not present

## 2024-06-28 DIAGNOSIS — E782 Mixed hyperlipidemia: Secondary | ICD-10-CM | POA: Diagnosis not present

## 2024-06-28 DIAGNOSIS — M2550 Pain in unspecified joint: Secondary | ICD-10-CM | POA: Diagnosis not present

## 2024-06-28 DIAGNOSIS — N1831 Chronic kidney disease, stage 3a: Secondary | ICD-10-CM | POA: Diagnosis not present

## 2024-06-28 DIAGNOSIS — I10 Essential (primary) hypertension: Secondary | ICD-10-CM | POA: Diagnosis not present

## 2024-06-28 DIAGNOSIS — Z1211 Encounter for screening for malignant neoplasm of colon: Secondary | ICD-10-CM | POA: Diagnosis not present

## 2024-06-28 DIAGNOSIS — M5459 Other low back pain: Secondary | ICD-10-CM | POA: Diagnosis not present

## 2024-08-26 DIAGNOSIS — Z1211 Encounter for screening for malignant neoplasm of colon: Secondary | ICD-10-CM | POA: Diagnosis not present

## 2024-09-10 DIAGNOSIS — K640 First degree hemorrhoids: Secondary | ICD-10-CM | POA: Diagnosis not present

## 2024-09-10 DIAGNOSIS — D124 Benign neoplasm of descending colon: Secondary | ICD-10-CM | POA: Diagnosis not present

## 2024-09-10 DIAGNOSIS — D120 Benign neoplasm of cecum: Secondary | ICD-10-CM | POA: Diagnosis not present

## 2024-09-10 DIAGNOSIS — Z1211 Encounter for screening for malignant neoplasm of colon: Secondary | ICD-10-CM | POA: Diagnosis not present

## 2024-09-10 DIAGNOSIS — D122 Benign neoplasm of ascending colon: Secondary | ICD-10-CM | POA: Diagnosis not present
